# Patient Record
Sex: Male | Born: 1975 | Race: Black or African American | Hispanic: No | Marital: Single | Smoking: Current every day smoker
Health system: Southern US, Community
[De-identification: ages and names within clinical notes are randomized; demographics above are authoritative.]

## PROBLEM LIST (undated history)

## (undated) HISTORY — PX: EYE SURGERY: SHX253

---

## 2013-03-25 ENCOUNTER — Emergency Department (HOSPITAL_COMMUNITY): Payer: Medicaid Other

## 2013-03-25 ENCOUNTER — Encounter (HOSPITAL_COMMUNITY): Payer: Self-pay | Admitting: *Deleted

## 2013-03-25 ENCOUNTER — Emergency Department (HOSPITAL_COMMUNITY)
Admission: EM | Admit: 2013-03-25 | Discharge: 2013-03-25 | Disposition: A | Discharge status: Home / Self Care | Payer: Medicaid Other | Attending: Emergency Medicine | Admitting: Emergency Medicine

## 2013-03-25 DIAGNOSIS — R1013 Epigastric pain: Secondary | ICD-10-CM | POA: Insufficient documentation

## 2013-03-25 DIAGNOSIS — R109 Unspecified abdominal pain: Secondary | ICD-10-CM

## 2013-03-25 MED ORDER — PANTOPRAZOLE SODIUM 40 MG PO TBEC
40.0000 mg | DELAYED_RELEASE_TABLET | Freq: Once | ORAL | Status: AC
  Administered 2013-03-25: 40 mg via ORAL
  Filled 2013-03-25: qty 1

## 2013-03-25 MED ORDER — FAMOTIDINE 20 MG PO TABS
20.0000 mg | ORAL_TABLET | Freq: Once | ORAL | Status: AC
  Administered 2013-03-25: 20 mg via ORAL
  Filled 2013-03-25: qty 1

## 2013-03-25 NOTE — ED Provider Notes (Signed)
CSN: [id number]     Arrival date & time 03/25/13  0100 History     First MD Initiated Contact with Patient 03/25/13 0158     Chief Complaint  Patient presents with  . Abdominal Pain    onset was was 2 days ago. last bm was 4 hrs ago   (Consider location/radiation/quality/duration/timing/severity/associated sxs/prior Treatment) HPI HPI Comments: Bob Foley is a 37 y.o. male who presents to the Emergency Department complaining of abdominal pain he has had for two days. He has had a number of stools, all normal. He has  Aching feeling int he epigastric area and occasional sharp pains. He denies fever, chills, nausea, vomiting. He has taken no medicines.  History reviewed. No pertinent past medical history. History reviewed. No pertinent past surgical history. No family history on file. History  Substance Use Topics  . Smoking status: Not on file  . Smokeless tobacco: Not on file  . Alcohol Use: Not on file    Review of Systems  Constitutional: Negative for fever.       10 Systems reviewed and are negative for acute change except as noted in the HPI.  HENT: Negative for congestion.   Eyes: Negative for discharge and redness.  Respiratory: Negative for cough and shortness of breath.   Cardiovascular: Negative for chest pain.  Gastrointestinal: Negative for vomiting and abdominal pain.  Musculoskeletal: Negative for back pain.  Skin: Negative for rash.  Neurological: Negative for syncope, numbness and headaches.  Psychiatric/Behavioral:       No behavior change.    Allergies  Asa  Home Medications  No current outpatient prescriptions on file. BP 139/99  Pulse 86  Temp(Src) 97.8 F (36.6 C) (Oral)  Resp 20  SpO2 100% Physical Exam  Nursing note and vitals reviewed. Constitutional: He appears well-developed and well-nourished.  Awake, alert, nontoxic appearance.  HENT:  Head: Normocephalic and atraumatic.  Eyes: EOM are normal. Pupils are equal, round, and  reactive to light.  Neck: Neck supple.  Cardiovascular: Normal rate and intact distal pulses.   Pulmonary/Chest: Effort normal and breath sounds normal. He exhibits no tenderness.  Abdominal: Soft. There is tenderness. There is no rebound.  Mild discomfort with palpation of the epigastric area  Musculoskeletal: He exhibits no tenderness.  Baseline ROM, no obvious new focal weakness.  Neurological:  Mental status and motor strength appears baseline for patient and situation.  Skin: No rash noted.  Psychiatric: He has a normal mood and affect.    ED Course   Procedures (including critical care time)  Labs Reviewed - No data to display Dg Abd Acute W/chest  03/25/2013   *RADIOLOGY REPORT*  Clinical Data: Abdominal pain for several days.  ACUTE ABDOMEN SERIES (ABDOMEN 2 VIEW & CHEST 1 VIEW)  Comparison: None.  Findings: Mild prominence of the right hilum is most compatible with prominent vascular markings.  No airspace disease.  No free air underneath the hemidiaphragms.  The bowel gas pattern is normal.  There are no dilated loops of large or small bowel.  No organomegaly.  Stool and bowel gas extends to the level of the rectosigmoid.  IMPRESSION: No acute abnormality.  Normal bowel gas pattern.   Original Report Authenticated By: Andreas Newport, M.D.   Medications  pantoprazole (PROTONIX) EC tablet 40 mg (40 mg Oral Given 03/25/13 0211)  famotidine (PEPCID) tablet 20 mg (20 mg Oral Given 03/25/13 0211)    MDM  Patient with epigastric discomfort for two days. Given pepcid and protonix with  some relief. Acute abdominal series is negative for acute process. Pt stable in ED with no significant deterioration in condition.The patient appears reasonably screened and/or stabilized for discharge and I doubt any other medical condition or other [facility] requiring further screening, evaluation, or treatment in the ED at this time prior to discharge.  MDM Reviewed: nursing note and vitals Interpretation:  x-ray     Nicoletta Dress. Colon Branch, MD 03/25/13 [phone]

## 2013-03-25 NOTE — ED Notes (Signed)
Patient thinks he has an ulcer . States he is not vomiting but has been having several bms a day but are not loose bms

## 2016-08-15 ENCOUNTER — Emergency Department (HOSPITAL_COMMUNITY)
Admission: EM | Admit: 2016-08-15 | Discharge: 2016-08-15 | Disposition: A | Discharge status: Home / Self Care | Payer: Medicaid Other | Attending: Emergency Medicine | Admitting: Emergency Medicine

## 2016-08-15 ENCOUNTER — Encounter (HOSPITAL_COMMUNITY): Payer: Self-pay | Admitting: Emergency Medicine

## 2016-08-15 DIAGNOSIS — R197 Diarrhea, unspecified: Secondary | ICD-10-CM | POA: Insufficient documentation

## 2016-08-15 DIAGNOSIS — F17210 Nicotine dependence, cigarettes, uncomplicated: Secondary | ICD-10-CM | POA: Diagnosis not present

## 2016-08-15 DIAGNOSIS — R112 Nausea with vomiting, unspecified: Secondary | ICD-10-CM | POA: Diagnosis not present

## 2016-08-15 LAB — CBC
HEMATOCRIT: 45.4 % (ref 39.0–52.0)
Hemoglobin: 15.1 g/dL (ref 13.0–17.0)
MCH: 29.2 pg (ref 26.0–34.0)
MCHC: 33.3 g/dL (ref 30.0–36.0)
MCV: 87.6 fL (ref 78.0–100.0)
Platelets: 200 10*3/uL (ref 150–400)
RBC: 5.18 MIL/uL (ref 4.22–5.81)
RDW: 12.5 % (ref 11.5–15.5)
WBC: 14 10*3/uL — ABNORMAL HIGH (ref 4.0–10.5)

## 2016-08-15 LAB — COMPREHENSIVE METABOLIC PANEL
ALBUMIN: 4.6 g/dL (ref 3.5–5.0)
ALK PHOS: 69 U/L (ref 38–126)
ALT: 16 U/L — ABNORMAL LOW (ref 17–63)
AST: 22 U/L (ref 15–41)
Anion gap: 9 (ref 5–15)
BILIRUBIN TOTAL: 0.6 mg/dL (ref 0.3–1.2)
BUN: 12 mg/dL (ref 6–20)
CO2: 22 mmol/L (ref 22–32)
Calcium: 9.9 mg/dL (ref 8.9–10.3)
Chloride: 106 mmol/L (ref 101–111)
Creatinine, Ser: 0.96 mg/dL (ref 0.61–1.24)
GFR calc Af Amer: >60 mL/min (ref >60)
GFR calc non Af Amer: >60 mL/min (ref >60)
GLUCOSE: 102 mg/dL — AB (ref 65–99)
POTASSIUM: 3.9 mmol/L (ref 3.5–5.1)
Sodium: 137 mmol/L (ref 135–145)
TOTAL PROTEIN: 8.1 g/dL (ref 6.5–8.1)

## 2016-08-15 LAB — URINALYSIS, ROUTINE W REFLEX MICROSCOPIC
BILIRUBIN URINE: NEGATIVE
Glucose, UA: NEGATIVE mg/dL
Hgb urine dipstick: NEGATIVE
Ketones, ur: 20 mg/dL — AB
Leukocytes, UA: NEGATIVE
NITRITE: NEGATIVE
PH: 5 (ref 5.0–8.0)
Protein, ur: 30 mg/dL — AB
SPECIFIC GRAVITY, URINE: 1.03 (ref 1.005–1.030)
WBC, UA: NONE SEEN WBC/hpf (ref 0–5)

## 2016-08-15 LAB — LIPASE, BLOOD: Lipase: 34 U/L (ref 11–51)

## 2016-08-15 MED ORDER — ONDANSETRON HCL 4 MG/2ML IJ SOLN
4.0000 mg | INTRAMUSCULAR | Status: DC | PRN
  Administered 2016-08-15: 4 mg via INTRAVENOUS
  Filled 2016-08-15: qty 2

## 2016-08-15 MED ORDER — FAMOTIDINE IN NACL 20-0.9 MG/50ML-% IV SOLN
20.0000 mg | Freq: Once | INTRAVENOUS | Status: AC
  Administered 2016-08-15: 20 mg via INTRAVENOUS
  Filled 2016-08-15: qty 50

## 2016-08-15 MED ORDER — SODIUM CHLORIDE 0.9 % IV BOLUS (SEPSIS)
1000.0000 mL | Freq: Once | INTRAVENOUS | Status: AC
  Administered 2016-08-15: 1000 mL via INTRAVENOUS

## 2016-08-15 MED ORDER — ONDANSETRON HCL 4 MG PO TABS: 4.0000 mg | ORAL_TABLET | Freq: Three times a day (TID) | ORAL | 0 refills | Status: DC | PRN

## 2016-08-15 MED ORDER — DICYCLOMINE HCL 20 MG PO TABS: 20.0000 mg | ORAL_TABLET | Freq: Four times a day (QID) | ORAL | 0 refills | Status: DC | PRN

## 2016-08-15 MED ORDER — DICYCLOMINE HCL 10 MG/ML IM SOLN
20.0000 mg | Freq: Once | INTRAMUSCULAR | Status: AC
  Administered 2016-08-15: 20 mg via INTRAMUSCULAR
  Filled 2016-08-15: qty 2

## 2016-08-15 NOTE — ED Provider Notes (Signed)
[facility]-EMERGENCY DEPT Provider Note   CSN: [id number] Arrival date & time: 08/15/16  1558     History   Chief Complaint Chief Complaint  Patient presents with  . Emesis    HPI Bob Foley is a 40 y.o. male.  HPI  Pt was seen at 1625. Per pt, c/o gradual onset and persistence of multiple intermittent episodes of N/V/D that began last night.   Describes the stools as "watery." Has been associated with generalized body "aches." Denies abd pain, no CP/SOB, no back pain, no fevers, no black or blood in stools or emesis.    History reviewed. No pertinent past medical history.  There are no active problems to display for this patient.   History reviewed. No pertinent surgical history.     Home Medications    Prior to Admission medications   Not on File    Family History History reviewed. No pertinent family history.  Social History Social History  Substance Use Topics  . Smoking status: Current Every Day Smoker    Packs/day: 1.00    Types: Cigarettes  . Smokeless tobacco: Not on file  . Alcohol use No     Allergies   Asa [aspirin]   Review of Systems Review of Systems ROS: Statement: All systems negative except as marked or noted in the HPI; Constitutional: Negative for fever and chills. ; ; Eyes: Negative for eye pain, redness and discharge. ; ; ENMT: Negative for ear pain, hoarseness, nasal congestion, sinus pressure and sore throat. ; ; Cardiovascular: Negative for chest pain, palpitations, diaphoresis, dyspnea and peripheral edema. ; ; Respiratory: Negative for cough, wheezing and stridor. ; ; Gastrointestinal: +N/V/D. Negative for abdominal pain, blood in stool, hematemesis, jaundice and rectal bleeding. . ; ; Genitourinary: Negative for dysuria, flank pain and hematuria. ; ; Musculoskeletal: Negative for back pain and neck pain. Negative for swelling and trauma.; ; Skin: Negative for pruritus, rash, abrasions, blisters, bruising and skin lesion.; ; Neuro:  Negative for headache, lightheadedness and neck stiffness. Negative for weakness, altered level of consciousness, altered mental status, extremity weakness, paresthesias, involuntary movement, seizure and syncope.       Physical Exam Updated Vital Signs BP 114/75 (BP Location: Left Arm)   Pulse 91   Temp 98.2 F (36.8 C) (Oral)   Resp 16   Ht 5\' 11"  (1.803 m)   Wt 180 lb (81.6 kg)   SpO2 100%   BMI 25.10 kg/m   Physical Exam 1630: Physical examination:  Nursing notes reviewed; Vital signs and O2 SAT reviewed;  Constitutional: Well developed, Well nourished, In no acute distress; Head:  Normocephalic, atraumatic; Eyes: EOMI, PERRL, No scleral icterus; ENMT: Mouth and pharynx normal, Mucous membranes dry; Neck: Supple, Full range of motion, No lymphadenopathy; Cardiovascular: Regular rate and rhythm, No murmur, rub, or gallop; Respiratory: Breath sounds clear & equal bilaterally, No rales, rhonchi, wheezes.  Speaking full sentences with ease, Normal respiratory effort/excursion; Chest: Nontender, Movement normal; Abdomen: Soft, Nontender, Nondistended, Normal bowel sounds; Genitourinary: No CVA tenderness; Extremities: Pulses normal, No tenderness, No edema, No calf edema or asymmetry.; Neuro: AA&Ox3, Major CN grossly intact.  Speech clear. No gross focal motor or sensory deficits in extremities.; Skin: Color normal, Warm, Dry.   ED Treatments / Results  Labs (all labs ordered are listed, but only abnormal results are displayed)   EKG  EKG Interpretation None       Radiology   Procedures Procedures (including critical care time)  Medications Ordered in ED Medications  sodium chloride 0.9 % bolus 1,000 mL (not administered)  ondansetron (ZOFRAN) injection 4 mg (not administered)  famotidine (PEPCID) IVPB 20 mg premix (not administered)     Initial Impression / Assessment and Plan / ED Course  I have reviewed the triage vital signs and the nursing notes.  Pertinent  labs & imaging results that were available during my care of the patient were reviewed by me and considered in my medical decision making (see chart for details).  MDM Reviewed: previous chart, nursing note and vitals Reviewed previous: labs Interpretation: labs   Results for orders placed or performed during the hospital encounter of 08/15/16  Lipase, blood  Result Value Ref Range   Lipase 34 11 - 51 U/L  Comprehensive metabolic panel  Result Value Ref Range   Sodium 137 135 - 145 mmol/L   Potassium 3.9 3.5 - 5.1 mmol/L   Chloride 106 101 - 111 mmol/L   CO2 22 22 - 32 mmol/L   Glucose, Bld 102 (H) 65 - 99 mg/dL   BUN 12 6 - 20 mg/dL   Creatinine, Ser 4.090.96 0.61 - 1.24 mg/dL   Calcium 9.9 8.9 - 81.110.3 mg/dL   Total Protein 8.1 6.5 - 8.1 g/dL   Albumin 4.6 3.5 - 5.0 g/dL   AST 22 15 - 41 U/L   ALT 16 (L) 17 - 63 U/L   Alkaline Phosphatase 69 38 - 126 U/L   Total Bilirubin 0.6 0.3 - 1.2 mg/dL   GFR calc non Af Amer >60 >60 mL/min   GFR calc Af Amer >60 >60 mL/min   Anion gap 9 5 - 15  CBC  Result Value Ref Range   WBC 14.0 (H) 4.0 - 10.5 K/uL   RBC 5.18 4.22 - 5.81 MIL/uL   Hemoglobin 15.1 13.0 - 17.0 g/dL   HCT 91.445.4 78.239.0 - 95.652.0 %   MCV 87.6 78.0 - 100.0 fL   MCH 29.2 26.0 - 34.0 pg   MCHC 33.3 30.0 - 36.0 g/dL   RDW 21.312.5 08.611.5 - 57.815.5 %   Platelets 200 150 - 400 K/uL  Urinalysis, Routine w reflex microscopic  Result Value Ref Range   Color, Urine YELLOW YELLOW   APPearance TURBID (A) CLEAR   Specific Gravity, Urine 1.030 1.005 - 1.030   pH 5.0 5.0 - 8.0   Glucose, UA NEGATIVE NEGATIVE mg/dL   Hgb urine dipstick NEGATIVE NEGATIVE   Bilirubin Urine NEGATIVE NEGATIVE   Ketones, ur 20 (A) NEGATIVE mg/dL   Protein, ur 30 (A) NEGATIVE mg/dL   Nitrite NEGATIVE NEGATIVE   Leukocytes, UA NEGATIVE NEGATIVE   RBC / HPF 0-5 0 - 5 RBC/hpf   WBC, UA NONE SEEN 0 - 5 WBC/hpf   Bacteria, UA FEW (A) NONE SEEN   Mucous PRESENT     2000:  Pt has tol PO well while in the ED without  N/V.  No stooling while in the ED.  Abd remains benign, VSS. Feels better and wants to go home now. Tx symptomatically at this time. Dx and testing d/w pt and family.  Questions answered.  Verb understanding, agreeable to d/c home with outpt f/u.     Final Clinical Impressions(s) / ED Diagnoses   Final diagnoses:  None    New Prescriptions New Prescriptions   No medications on file     Samuel JesterKathleen Chantale Leugers, DO 08/18/16 1707

## 2016-08-15 NOTE — Discharge Instructions (Signed)
Take the prescriptions as directed.  Increase your fluid intake (ie:  Gatoraide) for the next few days.  Eat a bland diet and advance to your regular diet slowly as you can tolerate it.   Avoid full strength juices, as well as milk and milk products until your diarrhea has resolved.   Call your regular medical doctor Tuesday to schedule a follow up appointment next week.  Return to the Emergency Department immediately if not improving (or even worsening) despite taking the medicines as prescribed, any black or bloody stool or vomit, if you develop a fever over "101," or for any other concerns.

## 2016-08-15 NOTE — ED Triage Notes (Signed)
Pt reports emesis since last night with diarrhea. Pt has generalized abd pain and body aches.

## 2018-05-10 ENCOUNTER — Emergency Department (HOSPITAL_COMMUNITY)
Admission: EM | Admit: 2018-05-10 | Discharge: 2018-05-10 | Disposition: A | Discharge status: Home / Self Care | Payer: Medicaid Other | Attending: Emergency Medicine | Admitting: Emergency Medicine

## 2018-05-10 ENCOUNTER — Other Ambulatory Visit: Payer: Self-pay

## 2018-05-10 ENCOUNTER — Emergency Department (HOSPITAL_COMMUNITY): Payer: Medicaid Other

## 2018-05-10 ENCOUNTER — Encounter (HOSPITAL_COMMUNITY): Payer: Self-pay | Admitting: *Deleted

## 2018-05-10 DIAGNOSIS — Y9289 Other specified places as the place of occurrence of the external cause: Secondary | ICD-10-CM | POA: Diagnosis not present

## 2018-05-10 DIAGNOSIS — M7918 Myalgia, other site: Secondary | ICD-10-CM | POA: Insufficient documentation

## 2018-05-10 DIAGNOSIS — Y999 Unspecified external cause status: Secondary | ICD-10-CM | POA: Insufficient documentation

## 2018-05-10 DIAGNOSIS — R0789 Other chest pain: Secondary | ICD-10-CM | POA: Diagnosis not present

## 2018-05-10 DIAGNOSIS — M25531 Pain in right wrist: Secondary | ICD-10-CM

## 2018-05-10 DIAGNOSIS — W01198A Fall on same level from slipping, tripping and stumbling with subsequent striking against other object, initial encounter: Secondary | ICD-10-CM | POA: Diagnosis not present

## 2018-05-10 DIAGNOSIS — S0121XA Laceration without foreign body of nose, initial encounter: Secondary | ICD-10-CM | POA: Diagnosis not present

## 2018-05-10 DIAGNOSIS — R52 Pain, unspecified: Secondary | ICD-10-CM

## 2018-05-10 DIAGNOSIS — S01122A Laceration with foreign body of left eyelid and periocular area, initial encounter: Secondary | ICD-10-CM | POA: Insufficient documentation

## 2018-05-10 DIAGNOSIS — Z79899 Other long term (current) drug therapy: Secondary | ICD-10-CM | POA: Insufficient documentation

## 2018-05-10 DIAGNOSIS — M25521 Pain in right elbow: Secondary | ICD-10-CM

## 2018-05-10 DIAGNOSIS — R51 Headache: Secondary | ICD-10-CM | POA: Diagnosis not present

## 2018-05-10 DIAGNOSIS — S0083XA Contusion of other part of head, initial encounter: Secondary | ICD-10-CM | POA: Diagnosis present

## 2018-05-10 DIAGNOSIS — Y9301 Activity, walking, marching and hiking: Secondary | ICD-10-CM | POA: Diagnosis not present

## 2018-05-10 DIAGNOSIS — S01112A Laceration without foreign body of left eyelid and periocular area, initial encounter: Secondary | ICD-10-CM

## 2018-05-10 DIAGNOSIS — M25532 Pain in left wrist: Secondary | ICD-10-CM

## 2018-05-10 DIAGNOSIS — M25522 Pain in left elbow: Secondary | ICD-10-CM

## 2018-05-10 MED ORDER — NAPROXEN 250 MG PO TABS
250.0000 mg | ORAL_TABLET | Freq: Once | ORAL | Status: AC
  Administered 2018-05-10: 250 mg via ORAL
  Filled 2018-05-10: qty 1

## 2018-05-10 NOTE — ED Notes (Signed)
ED Provider at bedside. 

## 2018-05-10 NOTE — ED Notes (Signed)
Pt given urinal to provide urine sample

## 2018-05-10 NOTE — Discharge Instructions (Signed)
Use ice to the painful areas for comfort.  You can take ibuprofen 600 mg 4 times a day OR leave 2 tablets over-the-counter twice a day for pain.  You will be sore for the next several days.  Return to the ED for any problems on the head injury sheet.  If he continued to have pain after the next week follow-up with your primary care doctor.

## 2018-05-10 NOTE — ED Notes (Addendum)
Pt called several times before coming back to room- registration reports pt has wheeled himself outside several times to smoke.

## 2018-05-10 NOTE — ED Triage Notes (Signed)
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
## 2018-08-02 ENCOUNTER — Other Ambulatory Visit: Payer: Self-pay

## 2018-08-02 ENCOUNTER — Emergency Department (HOSPITAL_COMMUNITY)
Admission: EM | Admit: 2018-08-02 | Discharge: 2018-08-02 | Discharge status: Against Medical Advice | Payer: Medicaid Other | Attending: Emergency Medicine | Admitting: Emergency Medicine

## 2018-08-02 ENCOUNTER — Emergency Department (HOSPITAL_COMMUNITY): Payer: Medicaid Other

## 2018-08-02 DIAGNOSIS — S02832A Fracture of medial orbital wall, left side, initial encounter for closed fracture: Secondary | ICD-10-CM | POA: Insufficient documentation

## 2018-08-02 DIAGNOSIS — F17210 Nicotine dependence, cigarettes, uncomplicated: Secondary | ICD-10-CM | POA: Diagnosis not present

## 2018-08-02 DIAGNOSIS — Y929 Unspecified place or not applicable: Secondary | ICD-10-CM | POA: Insufficient documentation

## 2018-08-02 DIAGNOSIS — Y9389 Activity, other specified: Secondary | ICD-10-CM | POA: Insufficient documentation

## 2018-08-02 DIAGNOSIS — S0280XA Fracture of other specified skull and facial bones, unspecified side, initial encounter for closed fracture: Secondary | ICD-10-CM

## 2018-08-02 DIAGNOSIS — S0285XA Fracture of orbit, unspecified, initial encounter for closed fracture: Secondary | ICD-10-CM

## 2018-08-02 DIAGNOSIS — Y998 Other external cause status: Secondary | ICD-10-CM | POA: Diagnosis not present

## 2018-08-02 DIAGNOSIS — S0592XA Unspecified injury of left eye and orbit, initial encounter: Secondary | ICD-10-CM | POA: Diagnosis present

## 2018-08-02 MED ORDER — FLUORESCEIN SODIUM 1 MG OP STRP
1.0000 | ORAL_STRIP | Freq: Once | OPHTHALMIC | Status: DC
  Filled 2018-08-02: qty 1
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
## 2020-02-09 ENCOUNTER — Other Ambulatory Visit: Payer: Self-pay

## 2020-02-09 ENCOUNTER — Emergency Department (HOSPITAL_COMMUNITY): Payer: Medicaid Other

## 2020-02-09 ENCOUNTER — Inpatient Hospital Stay (HOSPITAL_COMMUNITY)
Admission: AC | Admit: 2020-02-09 | Discharge: 2020-02-11 | DRG: 480 | Disposition: A | Discharge status: Home / Self Care | Payer: Medicaid Other | Source: Other Acute Inpatient Hospital | Attending: Orthopedic Surgery | Admitting: Orthopedic Surgery

## 2020-02-09 ENCOUNTER — Inpatient Hospital Stay (HOSPITAL_COMMUNITY): Payer: Medicaid Other

## 2020-02-09 ENCOUNTER — Encounter (HOSPITAL_COMMUNITY): Payer: Self-pay | Admitting: Orthopedic Surgery

## 2020-02-09 DIAGNOSIS — T79A29A Traumatic compartment syndrome of unspecified lower extremity, initial encounter: Secondary | ICD-10-CM | POA: Diagnosis present

## 2020-02-09 DIAGNOSIS — S82141A Displaced bicondylar fracture of right tibia, initial encounter for closed fracture: Secondary | ICD-10-CM

## 2020-02-09 DIAGNOSIS — W3400XA Accidental discharge from unspecified firearms or gun, initial encounter: Secondary | ICD-10-CM | POA: Diagnosis not present

## 2020-02-09 DIAGNOSIS — S72401B Unspecified fracture of lower end of right femur, initial encounter for open fracture type I or II: Secondary | ICD-10-CM

## 2020-02-09 DIAGNOSIS — Z886 Allergy status to analgesic agent status: Secondary | ICD-10-CM

## 2020-02-09 DIAGNOSIS — Z20822 Contact with and (suspected) exposure to covid-19: Secondary | ICD-10-CM | POA: Diagnosis present

## 2020-02-09 DIAGNOSIS — S72431B Displaced fracture of medial condyle of right femur, initial encounter for open fracture type I or II: Secondary | ICD-10-CM | POA: Diagnosis present

## 2020-02-09 DIAGNOSIS — E559 Vitamin D deficiency, unspecified: Secondary | ICD-10-CM | POA: Diagnosis present

## 2020-02-09 DIAGNOSIS — F17210 Nicotine dependence, cigarettes, uncomplicated: Secondary | ICD-10-CM | POA: Diagnosis present

## 2020-02-09 DIAGNOSIS — S82101B Unspecified fracture of upper end of right tibia, initial encounter for open fracture type I or II: Secondary | ICD-10-CM | POA: Diagnosis present

## 2020-02-09 DIAGNOSIS — T148XXA Other injury of unspecified body region, initial encounter: Secondary | ICD-10-CM

## 2020-02-09 DIAGNOSIS — S82144B Nondisplaced bicondylar fracture of right tibia, initial encounter for open fracture type I or II: Secondary | ICD-10-CM | POA: Diagnosis present

## 2020-02-09 DIAGNOSIS — S82201A Unspecified fracture of shaft of right tibia, initial encounter for closed fracture: Secondary | ICD-10-CM

## 2020-02-09 DIAGNOSIS — W458XXA Other foreign body or object entering through skin, initial encounter: Secondary | ICD-10-CM | POA: Diagnosis present

## 2020-02-09 DIAGNOSIS — Z419 Encounter for procedure for purposes other than remedying health state, unspecified: Secondary | ICD-10-CM

## 2020-02-09 DIAGNOSIS — T79A21A Traumatic compartment syndrome of right lower extremity, initial encounter: Secondary | ICD-10-CM | POA: Diagnosis present

## 2020-02-09 HISTORY — DX: Unspecified fracture of lower end of right femur, initial encounter for open fracture type I or II: S72.401B

## 2020-02-09 LAB — CBC WITH DIFFERENTIAL/PLATELET
Abs Immature Granulocytes: 0.06 10*3/uL (ref 0.00–0.07)
Basophils Absolute: 0 10*3/uL (ref 0.0–0.1)
Basophils Relative: 0 %
Eosinophils Absolute: 0 10*3/uL (ref 0.0–0.5)
Eosinophils Relative: 0 %
HCT: 32.6 % — ABNORMAL LOW (ref 39.0–52.0)
Hemoglobin: 10.7 g/dL — ABNORMAL LOW (ref 13.0–17.0)
Immature Granulocytes: 0 %
Lymphocytes Relative: 8 %
Lymphs Abs: 1.1 10*3/uL (ref 0.7–4.0)
MCH: 30 pg (ref 26.0–34.0)
MCHC: 32.8 g/dL (ref 30.0–36.0)
MCV: 91.3 fL (ref 80.0–100.0)
Monocytes Absolute: 0.8 10*3/uL (ref 0.1–1.0)
Monocytes Relative: 5 %
Neutro Abs: 12.7 10*3/uL — ABNORMAL HIGH (ref 1.7–7.7)
Neutrophils Relative %: 87 %
Platelets: 209 10*3/uL (ref 150–400)
RBC: 3.57 MIL/uL — ABNORMAL LOW (ref 4.22–5.81)
RDW: 13.5 % (ref 11.5–15.5)
WBC: 14.7 10*3/uL — ABNORMAL HIGH (ref 4.0–10.5)
nRBC: 0 % (ref 0.0–0.2)

## 2020-02-09 LAB — BASIC METABOLIC PANEL
Anion gap: 13 (ref 5–15)
BUN: 11 mg/dL (ref 6–20)
CO2: 16 mmol/L — ABNORMAL LOW (ref 22–32)
Calcium: 7.1 mg/dL — ABNORMAL LOW (ref 8.9–10.3)
Chloride: 110 mmol/L (ref 98–111)
Creatinine, Ser: 0.95 mg/dL (ref 0.61–1.24)
GFR calc Af Amer: >60 mL/min (ref >60)
GFR calc non Af Amer: >60 mL/min (ref >60)
Glucose, Bld: 87 mg/dL (ref 70–99)
Potassium: 3.4 mmol/L — ABNORMAL LOW (ref 3.5–5.1)
Sodium: 139 mmol/L (ref 135–145)

## 2020-02-09 LAB — SARS CORONAVIRUS 2 BY RT PCR (HOSPITAL ORDER, PERFORMED IN ~~LOC~~ HOSPITAL LAB): SARS Coronavirus 2: NEGATIVE

## 2020-02-09 LAB — HIV ANTIBODY (ROUTINE TESTING W REFLEX): HIV Screen 4th Generation wRfx: NONREACTIVE

## 2020-02-09 MED ORDER — FENTANYL CITRATE (PF) 100 MCG/2ML IJ SOLN
100.0000 ug | Freq: Once | INTRAMUSCULAR | Status: AC
  Administered 2020-02-09: 100 ug via INTRAVENOUS
  Filled 2020-02-09: qty 2

## 2020-02-09 MED ORDER — LACTATED RINGERS IV SOLN: INTRAVENOUS | Status: DC

## 2020-02-09 MED ORDER — HYDROXYZINE HCL 25 MG PO TABS
50.0000 mg | ORAL_TABLET | Freq: Three times a day (TID) | ORAL | Status: DC | PRN
  Administered 2020-02-09: 50 mg via ORAL
  Filled 2020-02-09: qty 2

## 2020-02-09 MED ORDER — CEFAZOLIN SODIUM-DEXTROSE 2-4 GM/100ML-% IV SOLN
2.0000 g | INTRAVENOUS | Status: AC
  Administered 2020-02-10: 2 g via INTRAVENOUS
  Filled 2020-02-09: qty 100

## 2020-02-09 MED ORDER — LACTATED RINGERS IV BOLUS
2000.0000 mL | Freq: Once | INTRAVENOUS | Status: AC
  Administered 2020-02-09: 2000 mL via INTRAVENOUS

## 2020-02-09 MED ORDER — MORPHINE SULFATE (PF) 4 MG/ML IV SOLN
4.0000 mg | Freq: Once | INTRAVENOUS | Status: AC
  Administered 2020-02-09: 4 mg via INTRAVENOUS
  Filled 2020-02-09: qty 1

## 2020-02-09 MED ORDER — POVIDONE-IODINE 10 % EX SWAB: 2.0000 "application " | Freq: Once | CUTANEOUS | Status: DC

## 2020-02-09 MED ORDER — ENOXAPARIN SODIUM 40 MG/0.4ML ~~LOC~~ SOLN
40.0000 mg | SUBCUTANEOUS | Status: DC
  Administered 2020-02-09: 40 mg via SUBCUTANEOUS
  Filled 2020-02-09: qty 0.4

## 2020-02-09 MED ORDER — CHLORHEXIDINE GLUCONATE 4 % EX LIQD: 60.0000 mL | Freq: Once | CUTANEOUS | Status: DC

## 2020-02-09 MED ORDER — ONDANSETRON HCL 4 MG/2ML IJ SOLN: 4.0000 mg | Freq: Four times a day (QID) | INTRAMUSCULAR | Status: DC | PRN

## 2020-02-09 MED ORDER — OXYCODONE HCL 5 MG PO TABS
5.0000 mg | ORAL_TABLET | ORAL | Status: DC | PRN
  Administered 2020-02-09: 15 mg via ORAL
  Administered 2020-02-09: 10 mg via ORAL
  Administered 2020-02-09 – 2020-02-10 (×3): 15 mg via ORAL
  Filled 2020-02-09: qty 3
  Filled 2020-02-09: qty 2
  Filled 2020-02-09 (×3): qty 3

## 2020-02-09 MED ORDER — MORPHINE SULFATE (PF) 2 MG/ML IV SOLN
2.0000 mg | INTRAVENOUS | Status: DC | PRN
  Administered 2020-02-09 – 2020-02-10 (×5): 2 mg via INTRAVENOUS
  Filled 2020-02-09 (×4): qty 1

## 2020-02-09 MED ORDER — METHOCARBAMOL 500 MG PO TABS
500.0000 mg | ORAL_TABLET | Freq: Four times a day (QID) | ORAL | Status: DC | PRN
  Administered 2020-02-09 – 2020-02-10 (×3): 500 mg via ORAL
  Filled 2020-02-09 (×3): qty 1

## 2020-02-09 MED ORDER — METHOCARBAMOL 1000 MG/10ML IJ SOLN
500.0000 mg | Freq: Four times a day (QID) | INTRAVENOUS | Status: DC | PRN
  Filled 2020-02-09: qty 5

## 2020-02-09 MED ORDER — ENSURE PRE-SURGERY PO LIQD
296.0000 mL | Freq: Once | ORAL | Status: DC
  Filled 2020-02-09: qty 296

## 2020-02-09 MED ORDER — ONDANSETRON HCL 4 MG PO TABS: 4.0000 mg | ORAL_TABLET | Freq: Four times a day (QID) | ORAL | Status: DC | PRN
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
## 2020-02-09 NOTE — ED Notes (Signed)
Called 5N to give report, no answer at this time.
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
## 2020-02-09 NOTE — ED Notes (Signed)
Pt given ginger ale.

## 2020-02-09 NOTE — Progress Notes (Signed)
Catarina Hartshorn, floor nurse, to make aware that consent order for surgery was in signed and held orders. Orders released at this time. Verbalized understanding.

## 2020-02-09 NOTE — ED Notes (Signed)
Report given to 5N 
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
## 2020-02-10 ENCOUNTER — Inpatient Hospital Stay (HOSPITAL_COMMUNITY): Payer: Medicaid Other | Admitting: Anesthesiology

## 2020-02-10 ENCOUNTER — Encounter (HOSPITAL_COMMUNITY): Payer: Self-pay | Admitting: Orthopedic Surgery

## 2020-02-10 ENCOUNTER — Inpatient Hospital Stay (HOSPITAL_COMMUNITY): Payer: Medicaid Other

## 2020-02-10 ENCOUNTER — Encounter (HOSPITAL_COMMUNITY)
Admission: AC | Disposition: A | Discharge status: Home / Self Care | Payer: Self-pay | Source: Other Acute Inpatient Hospital | Attending: Orthopedic Surgery

## 2020-02-10 HISTORY — PX: ORIF TIBIA PLATEAU: SHX2132

## 2020-02-10 HISTORY — PX: ORIF FEMUR FRACTURE: SHX2119

## 2020-02-10 LAB — CBC
HCT: 30.1 % — ABNORMAL LOW (ref 39.0–52.0)
Hemoglobin: 10.1 g/dL — ABNORMAL LOW (ref 13.0–17.0)
MCH: 30.2 pg (ref 26.0–34.0)
MCHC: 33.6 g/dL (ref 30.0–36.0)
MCV: 90.1 fL (ref 80.0–100.0)
Platelets: 162 10*3/uL (ref 150–400)
RBC: 3.34 MIL/uL — ABNORMAL LOW (ref 4.22–5.81)
RDW: 12.9 % (ref 11.5–15.5)
WBC: 8.2 10*3/uL (ref 4.0–10.5)
nRBC: 0 % (ref 0.0–0.2)

## 2020-02-10 LAB — VITAMIN D 25 HYDROXY (VIT D DEFICIENCY, FRACTURES): Vit D, 25-Hydroxy: 19.72 ng/mL — ABNORMAL LOW (ref 30–100)

## 2020-02-10 SURGERY — OPEN REDUCTION INTERNAL FIXATION (ORIF) DISTAL FEMUR FRACTURE
Anesthesia: General | Site: Knee | Laterality: Right

## 2020-02-10 MED ORDER — METHOCARBAMOL 1000 MG/10ML IJ SOLN
500.0000 mg | Freq: Four times a day (QID) | INTRAVENOUS | Status: DC
  Filled 2020-02-10 (×7): qty 5

## 2020-02-10 MED ORDER — 0.9 % SODIUM CHLORIDE (POUR BTL) OPTIME
TOPICAL | Status: DC | PRN
  Administered 2020-02-10 (×2): 1000 mL

## 2020-02-10 MED ORDER — DOCUSATE SODIUM 100 MG PO CAPS
100.0000 mg | ORAL_CAPSULE | Freq: Two times a day (BID) | ORAL | Status: DC
  Administered 2020-02-10 – 2020-02-11 (×2): 100 mg via ORAL
  Filled 2020-02-10 (×2): qty 1

## 2020-02-10 MED ORDER — SUGAMMADEX SODIUM 200 MG/2ML IV SOLN
INTRAVENOUS | Status: DC | PRN
  Administered 2020-02-10: 160 mg via INTRAVENOUS

## 2020-02-10 MED ORDER — ROCURONIUM BROMIDE 10 MG/ML (PF) SYRINGE
PREFILLED_SYRINGE | INTRAVENOUS | Status: DC | PRN
  Administered 2020-02-10 (×3): 50 mg via INTRAVENOUS

## 2020-02-10 MED ORDER — KETAMINE HCL 50 MG/5ML IJ SOSY
PREFILLED_SYRINGE | INTRAMUSCULAR | Status: AC
  Filled 2020-02-10: qty 5

## 2020-02-10 MED ORDER — MAGNESIUM HYDROXIDE 400 MG/5ML PO SUSP: 30.0000 mL | Freq: Every day | ORAL | Status: DC | PRN

## 2020-02-10 MED ORDER — FENTANYL CITRATE (PF) 100 MCG/2ML IJ SOLN
INTRAMUSCULAR | Status: DC | PRN
  Administered 2020-02-10: 100 ug via INTRAVENOUS
  Administered 2020-02-10: 50 ug via INTRAVENOUS

## 2020-02-10 MED ORDER — HYDROMORPHONE HCL 1 MG/ML IJ SOLN: 0.2500 mg | INTRAMUSCULAR | Status: DC | PRN

## 2020-02-10 MED ORDER — ASCORBIC ACID 500 MG PO TABS
1000.0000 mg | ORAL_TABLET | Freq: Every day | ORAL | Status: DC
  Administered 2020-02-11: 1000 mg via ORAL
  Filled 2020-02-10: qty 2

## 2020-02-10 MED ORDER — ONDANSETRON HCL 4 MG/2ML IJ SOLN
INTRAMUSCULAR | Status: DC | PRN
  Administered 2020-02-10: 4 mg via INTRAVENOUS

## 2020-02-10 MED ORDER — METHOCARBAMOL 500 MG PO TABS
750.0000 mg | ORAL_TABLET | Freq: Four times a day (QID) | ORAL | Status: DC
  Administered 2020-02-10 – 2020-02-11 (×5): 750 mg via ORAL
  Filled 2020-02-10 (×5): qty 2

## 2020-02-10 MED ORDER — CHLORHEXIDINE GLUCONATE 0.12 % MT SOLN: 15.0000 mL | Freq: Once | OROMUCOSAL | Status: AC

## 2020-02-10 MED ORDER — FENTANYL CITRATE (PF) 250 MCG/5ML IJ SOLN
INTRAMUSCULAR | Status: AC
  Filled 2020-02-10: qty 5

## 2020-02-10 MED ORDER — ONDANSETRON HCL 4 MG/2ML IJ SOLN: 4.0000 mg | Freq: Once | INTRAMUSCULAR | Status: DC | PRN

## 2020-02-10 MED ORDER — OXYCODONE HCL 5 MG/5ML PO SOLN: 5.0000 mg | Freq: Once | ORAL | Status: DC | PRN

## 2020-02-10 MED ORDER — CHLORHEXIDINE GLUCONATE 0.12 % MT SOLN
OROMUCOSAL | Status: AC
  Administered 2020-02-10: 15 mL via OROMUCOSAL
  Filled 2020-02-10: qty 15

## 2020-02-10 MED ORDER — DEXMEDETOMIDINE HCL IN NACL 80 MCG/20ML IV SOLN
INTRAVENOUS | Status: AC
  Filled 2020-02-10: qty 20

## 2020-02-10 MED ORDER — DEXAMETHASONE SODIUM PHOSPHATE 10 MG/ML IJ SOLN
INTRAMUSCULAR | Status: DC | PRN
  Administered 2020-02-10: 10 mg via INTRAVENOUS

## 2020-02-10 MED ORDER — VITAMIN D 25 MCG (1000 UNIT) PO TABS
2000.0000 [IU] | ORAL_TABLET | Freq: Two times a day (BID) | ORAL | Status: DC
  Administered 2020-02-10 – 2020-02-11 (×2): 2000 [IU] via ORAL
  Filled 2020-02-10 (×2): qty 2

## 2020-02-10 MED ORDER — ACETAMINOPHEN 325 MG PO TABS: 325.0000 mg | ORAL_TABLET | Freq: Four times a day (QID) | ORAL | Status: DC | PRN

## 2020-02-10 MED ORDER — PANTOPRAZOLE SODIUM 40 MG PO TBEC
40.0000 mg | DELAYED_RELEASE_TABLET | Freq: Every day | ORAL | Status: DC
  Administered 2020-02-10 – 2020-02-11 (×2): 40 mg via ORAL
  Filled 2020-02-10 (×2): qty 1

## 2020-02-10 MED ORDER — METOCLOPRAMIDE HCL 5 MG/ML IJ SOLN: 5.0000 mg | Freq: Three times a day (TID) | INTRAMUSCULAR | Status: DC | PRN

## 2020-02-10 MED ORDER — CEFAZOLIN SODIUM-DEXTROSE 2-4 GM/100ML-% IV SOLN
2.0000 g | Freq: Three times a day (TID) | INTRAVENOUS | Status: AC
  Administered 2020-02-10 – 2020-02-11 (×3): 2 g via INTRAVENOUS
  Filled 2020-02-10 (×3): qty 100

## 2020-02-10 MED ORDER — METOCLOPRAMIDE HCL 5 MG PO TABS: 5.0000 mg | ORAL_TABLET | Freq: Three times a day (TID) | ORAL | Status: DC | PRN

## 2020-02-10 MED ORDER — OXYCODONE HCL 5 MG PO TABS: 5.0000 mg | ORAL_TABLET | Freq: Once | ORAL | Status: DC | PRN

## 2020-02-10 MED ORDER — POTASSIUM CHLORIDE IN NACL 20-0.9 MEQ/L-% IV SOLN
INTRAVENOUS | Status: DC
  Filled 2020-02-10: qty 1000

## 2020-02-10 MED ORDER — ENOXAPARIN SODIUM 40 MG/0.4ML ~~LOC~~ SOLN
40.0000 mg | SUBCUTANEOUS | Status: DC
  Administered 2020-02-11: 40 mg via SUBCUTANEOUS
  Filled 2020-02-10: qty 0.4

## 2020-02-10 MED ORDER — BISACODYL 5 MG PO TBEC: 5.0000 mg | DELAYED_RELEASE_TABLET | Freq: Every day | ORAL | Status: DC | PRN

## 2020-02-10 MED ORDER — MIDAZOLAM HCL 5 MG/5ML IJ SOLN
INTRAMUSCULAR | Status: DC | PRN
  Administered 2020-02-10: 2 mg via INTRAVENOUS

## 2020-02-10 MED ORDER — LACTATED RINGERS IV SOLN: INTRAVENOUS | Status: DC

## 2020-02-10 MED ORDER — PROPOFOL 10 MG/ML IV BOLUS
INTRAVENOUS | Status: DC | PRN
  Administered 2020-02-10: 200 mg via INTRAVENOUS

## 2020-02-10 MED ORDER — OXYCODONE-ACETAMINOPHEN 7.5-325 MG PO TABS: 1.0000 | ORAL_TABLET | Freq: Four times a day (QID) | ORAL | Status: DC | PRN

## 2020-02-10 MED ORDER — ACETAMINOPHEN 500 MG PO TABS
500.0000 mg | ORAL_TABLET | Freq: Three times a day (TID) | ORAL | Status: DC
  Administered 2020-02-10 – 2020-02-11 (×4): 500 mg via ORAL
  Filled 2020-02-10 (×4): qty 1

## 2020-02-10 MED ORDER — ORAL CARE MOUTH RINSE: 15.0000 mL | Freq: Once | OROMUCOSAL | Status: AC

## 2020-02-10 MED ORDER — HYDROMORPHONE HCL 1 MG/ML IJ SOLN
0.5000 mg | INTRAMUSCULAR | Status: DC | PRN
  Administered 2020-02-10: 1 mg via INTRAVENOUS
  Filled 2020-02-10 (×2): qty 1

## 2020-02-10 MED ORDER — ADULT MULTIVITAMIN W/MINERALS CH
1.0000 | ORAL_TABLET | Freq: Every day | ORAL | Status: DC
  Administered 2020-02-11: 1 via ORAL
  Filled 2020-02-10: qty 1

## 2020-02-10 MED ORDER — MIDAZOLAM HCL 2 MG/2ML IJ SOLN
INTRAMUSCULAR | Status: AC
  Filled 2020-02-10: qty 2

## 2020-02-10 MED ORDER — ONDANSETRON HCL 4 MG/2ML IJ SOLN: 4.0000 mg | Freq: Four times a day (QID) | INTRAMUSCULAR | Status: DC | PRN

## 2020-02-10 MED ORDER — GABAPENTIN 600 MG PO TABS
300.0000 mg | ORAL_TABLET | Freq: Two times a day (BID) | ORAL | Status: DC
  Administered 2020-02-10 – 2020-02-11 (×3): 300 mg via ORAL
  Filled 2020-02-10 (×4): qty 0.5

## 2020-02-10 MED ORDER — PROPOFOL 10 MG/ML IV BOLUS
INTRAVENOUS | Status: AC
  Filled 2020-02-10: qty 40

## 2020-02-10 MED ORDER — ONDANSETRON HCL 4 MG PO TABS: 4.0000 mg | ORAL_TABLET | Freq: Four times a day (QID) | ORAL | Status: DC | PRN

## 2020-02-10 SURGICAL SUPPLY — 94 items
BANDAGE ESMARK 6X9 LF (GAUZE/BANDAGES/DRESSINGS) ×1 IMPLANT
BIT DRILL 3.0 6IN (DRILL) ×1 IMPLANT
BIT DRILL 4.8X200 CANN (BIT) ×3 IMPLANT
BLADE CLIPPER SURG (BLADE) IMPLANT
BLADE SURG 10 STRL SS (BLADE) ×3 IMPLANT
BLADE SURG 15 STRL LF DISP TIS (BLADE) ×1 IMPLANT
BLADE SURG 15 STRL SS (BLADE) ×2
BNDG COHESIVE 4X5 TAN STRL (GAUZE/BANDAGES/DRESSINGS) IMPLANT
BNDG ELASTIC 4X5.8 VLCR STR LF (GAUZE/BANDAGES/DRESSINGS) ×3 IMPLANT
BNDG ELASTIC 6X5.8 VLCR STR LF (GAUZE/BANDAGES/DRESSINGS) ×3 IMPLANT
BNDG ESMARK 6X9 LF (GAUZE/BANDAGES/DRESSINGS) ×3
BNDG GAUZE ELAST 4 BULKY (GAUZE/BANDAGES/DRESSINGS) ×3 IMPLANT
BRUSH SCRUB EZ PLAIN DRY (MISCELLANEOUS) ×6 IMPLANT
CANISTER SUCT 3000ML PPV (MISCELLANEOUS) ×3 IMPLANT
COVER SURGICAL LIGHT HANDLE (MISCELLANEOUS) ×3 IMPLANT
COVER WAND RF STERILE (DRAPES) IMPLANT
CUFF TOURN SGL QUICK 34 (TOURNIQUET CUFF)
CUFF TRNQT CYL 34X4.125X (TOURNIQUET CUFF) IMPLANT
DRAPE C-ARM 42X72 X-RAY (DRAPES) ×3 IMPLANT
DRAPE C-ARMOR (DRAPES) ×3 IMPLANT
DRAPE HALF SHEET 40X57 (DRAPES) IMPLANT
DRAPE IMP U-DRAPE 54X76 (DRAPES) ×3 IMPLANT
DRAPE INCISE IOBAN 66X45 STRL (DRAPES) ×3 IMPLANT
DRAPE ORTHO SPLIT 77X108 STRL (DRAPES) ×4
DRAPE SURG ORHT 6 SPLT 77X108 (DRAPES) ×2 IMPLANT
DRAPE U-SHAPE 47X51 STRL (DRAPES) ×3 IMPLANT
DRILL 3.0 6IN (DRILL) ×3
DRSG ADAPTIC 3X8 NADH LF (GAUZE/BANDAGES/DRESSINGS) ×6 IMPLANT
DRSG PAD ABDOMINAL 8X10 ST (GAUZE/BANDAGES/DRESSINGS) IMPLANT
ELECT REM PT RETURN 9FT ADLT (ELECTROSURGICAL) ×3
ELECTRODE REM PT RTRN 9FT ADLT (ELECTROSURGICAL) ×1 IMPLANT
EVACUATOR 1/8 PVC DRAIN (DRAIN) IMPLANT
EVACUATOR 3/16  PVC DRAIN (DRAIN)
EVACUATOR 3/16 PVC DRAIN (DRAIN) IMPLANT
GAUZE SPONGE 4X4 12PLY STRL (GAUZE/BANDAGES/DRESSINGS) IMPLANT
GAUZE SPONGE 4X4 12PLY STRL LF (GAUZE/BANDAGES/DRESSINGS) ×6 IMPLANT
GLOVE BIO SURGEON STRL SZ7.5 (GLOVE) ×3 IMPLANT
GLOVE BIO SURGEON STRL SZ8 (GLOVE) ×3 IMPLANT
GLOVE BIOGEL PI IND STRL 7.5 (GLOVE) ×1 IMPLANT
GLOVE BIOGEL PI IND STRL 8 (GLOVE) ×1 IMPLANT
GLOVE BIOGEL PI INDICATOR 7.5 (GLOVE) ×2
GLOVE BIOGEL PI INDICATOR 8 (GLOVE) ×2
GOWN STRL REUS W/ TWL LRG LVL3 (GOWN DISPOSABLE) ×2 IMPLANT
GOWN STRL REUS W/ TWL XL LVL3 (GOWN DISPOSABLE) ×1 IMPLANT
GOWN STRL REUS W/TWL LRG LVL3 (GOWN DISPOSABLE) ×4
GOWN STRL REUS W/TWL XL LVL3 (GOWN DISPOSABLE) ×2
GUIDEWIRE 1.6 6IN (WIRE) ×9 IMPLANT
IMMOBILIZER KNEE 22 UNIV (SOFTGOODS) ×3 IMPLANT
KIT BASIN OR (CUSTOM PROCEDURE TRAY) ×3 IMPLANT
KIT TURNOVER KIT B (KITS) ×3 IMPLANT
NDL SUT 6 .5 CRC .975X.05 MAYO (NEEDLE) IMPLANT
NEEDLE 22X1 1/2 (OR ONLY) (NEEDLE) IMPLANT
NEEDLE MAYO TAPER (NEEDLE)
NS IRRIG 1000ML POUR BTL (IV SOLUTION) ×3 IMPLANT
PACK ORTHO EXTREMITY (CUSTOM PROCEDURE TRAY) ×3 IMPLANT
PACK TOTAL JOINT (CUSTOM PROCEDURE TRAY) ×3 IMPLANT
PACK UNIVERSAL I (CUSTOM PROCEDURE TRAY) ×3 IMPLANT
PAD ABD 8X10 STRL (GAUZE/BANDAGES/DRESSINGS) ×6 IMPLANT
PAD ARMBOARD 7.5X6 YLW CONV (MISCELLANEOUS) ×6 IMPLANT
PAD CAST 4YDX4 CTTN HI CHSV (CAST SUPPLIES) ×1 IMPLANT
PADDING CAST COTTON 4X4 STRL (CAST SUPPLIES) ×2
PADDING CAST COTTON 6X4 STRL (CAST SUPPLIES) ×3 IMPLANT
PIN GUIDE DRILL TIP 2.8X300 (DRILL) ×12 IMPLANT
SCREW CANN 6.5X85X40MM THRD (Screw) ×6 IMPLANT
SCREW CANN 70X40X6.5 (Screw) ×1 IMPLANT
SCREW CANN 80X40X6.5 NS (Screw) ×1 IMPLANT
SCREW CANN HDLS 4X46 (Screw) ×3 IMPLANT
SCREW CANNULATED 6.5X70MM (Screw) ×2 IMPLANT
SCREW CANNULATED 6.5X80MM (Screw) ×2 IMPLANT
SET MONITOR QUICK PRESSURE (MISCELLANEOUS) ×3 IMPLANT
SPONGE LAP 18X18 RF (DISPOSABLE) IMPLANT
STAPLER VISISTAT 35W (STAPLE) ×3 IMPLANT
STOCKINETTE IMPERVIOUS LG (DRAPES) IMPLANT
SUCTION FRAZIER HANDLE 10FR (MISCELLANEOUS) ×2
SUCTION TUBE FRAZIER 10FR DISP (MISCELLANEOUS) ×1 IMPLANT
SUT ETHILON 2 0 FS 18 (SUTURE) ×6 IMPLANT
SUT ETHILON 2 0 PSLX (SUTURE) ×3 IMPLANT
SUT ETHILON 3 0 PS 1 (SUTURE) IMPLANT
SUT PROLENE 0 CT 2 (SUTURE) ×6 IMPLANT
SUT VIC AB 0 CT1 27 (SUTURE) ×4
SUT VIC AB 0 CT1 27XBRD ANBCTR (SUTURE) ×2 IMPLANT
SUT VIC AB 1 CT1 27 (SUTURE) ×4
SUT VIC AB 1 CT1 27XBRD ANBCTR (SUTURE) ×2 IMPLANT
SUT VIC AB 2-0 CT1 27 (SUTURE) ×4
SUT VIC AB 2-0 CT1 TAPERPNT 27 (SUTURE) ×2 IMPLANT
SYR 20ML ECCENTRIC (SYRINGE) IMPLANT
TOWEL GREEN STERILE (TOWEL DISPOSABLE) ×6 IMPLANT
TOWEL GREEN STERILE FF (TOWEL DISPOSABLE) ×3 IMPLANT
TRAY FOLEY MTR SLVR 16FR STAT (SET/KITS/TRAYS/PACK) IMPLANT
TUBE CONNECTING 12'X1/4 (SUCTIONS)
TUBE CONNECTING 12X1/4 (SUCTIONS) IMPLANT
WASHER FLAT 6.5MM (Washer) ×3 IMPLANT
WATER STERILE IRR 1000ML POUR (IV SOLUTION) ×6 IMPLANT
YANKAUER SUCT BULB TIP NO VENT (SUCTIONS) ×3 IMPLANT
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
## 2020-02-10 NOTE — Transfer of Care (Signed)
Immediate Anesthesia Transfer of Care Note  Patient: Bob Foley  Procedure(s) Performed: OPEN REDUCTION INTERNAL FIXATION (ORIF) DISTAL FEMUR FRACTURE (Right Knee) OPEN REDUCTION INTERNAL FIXATION (ORIF) TIBIAL PLATEAU (Right Knee)  Patient Location: PACU  Anesthesia Type:General  Level of Consciousness: drowsy  Airway & Oxygen Therapy: Patient Spontanous Breathing and Patient connected to nasal cannula oxygen  Post-op Assessment: Report given to RN and Post -op Vital signs reviewed and stable  Post vital signs: Reviewed and stable  Last Vitals:  Vitals Value Taken Time  BP 166/105 02/10/20 1125  Temp 36.8 C 02/10/20 1125  Pulse 90 02/10/20 1127  Resp 14 02/10/20 1127  SpO2 99 % 02/10/20 1127  Vitals shown include unvalidated device data.  Last Pain:  Vitals:   02/10/20 1125  TempSrc:   PainSc: (P) Asleep         Complications: No complications documented.
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
## 2020-02-10 NOTE — Progress Notes (Signed)
Orthopedic Tech Progress Note Patient Details:  Eliud Polo [birth year] [id number] Called In order to [employer] for a ROM KNEE BRACE Patient ID: Kaige Whistler, male   DOB: October 08, 1975, 44 y.o.   MRN: [id number]   Donald Pore 02/10/2020, 12:16 PM

## 2020-02-10 NOTE — Progress Notes (Signed)
Orthopedic Tech Progress Note Patient Details:  Bob Foley 1976/08/03 [id number] Micah Flesher to apply an OVER HEAD FRAME with TRAPEZE but patient bed has on old frame Patient ID: [name], male   DOB: 24-Dec-1975, 44 y.o.   MRN: [id number]   Donald Pore 02/10/2020, 2:35 PM

## 2020-02-11 ENCOUNTER — Encounter (HOSPITAL_COMMUNITY): Payer: Self-pay | Admitting: Orthopedic Surgery

## 2020-02-11 DIAGNOSIS — T79A29A Traumatic compartment syndrome of unspecified lower extremity, initial encounter: Secondary | ICD-10-CM

## 2020-02-11 DIAGNOSIS — S82101B Unspecified fracture of upper end of right tibia, initial encounter for open fracture type I or II: Secondary | ICD-10-CM

## 2020-02-11 DIAGNOSIS — E559 Vitamin D deficiency, unspecified: Secondary | ICD-10-CM

## 2020-02-11 DIAGNOSIS — W3400XA Accidental discharge from unspecified firearms or gun, initial encounter: Secondary | ICD-10-CM

## 2020-02-11 HISTORY — DX: Accidental discharge from unspecified firearms or gun, initial encounter: W34.00XA

## 2020-02-11 HISTORY — DX: Unspecified fracture of upper end of right tibia, initial encounter for open fracture type I or II: S82.101B

## 2020-02-11 HISTORY — DX: Vitamin D deficiency, unspecified: E55.9

## 2020-02-11 HISTORY — DX: Traumatic compartment syndrome of unspecified lower extremity, initial encounter: T79.A29A

## 2020-02-11 LAB — CBC
HCT: 26 % — ABNORMAL LOW (ref 39.0–52.0)
Hemoglobin: 8.6 g/dL — ABNORMAL LOW (ref 13.0–17.0)
MCH: 29.7 pg (ref 26.0–34.0)
MCHC: 33.1 g/dL (ref 30.0–36.0)
MCV: 89.7 fL (ref 80.0–100.0)
Platelets: 129 10*3/uL — ABNORMAL LOW (ref 150–400)
RBC: 2.9 MIL/uL — ABNORMAL LOW (ref 4.22–5.81)
RDW: 12.4 % (ref 11.5–15.5)
WBC: 8.7 10*3/uL (ref 4.0–10.5)
nRBC: 0 % (ref 0.0–0.2)

## 2020-02-11 LAB — COMPREHENSIVE METABOLIC PANEL
ALT: 29 U/L (ref 0–44)
AST: 66 U/L — ABNORMAL HIGH (ref 15–41)
Albumin: 3.1 g/dL — ABNORMAL LOW (ref 3.5–5.0)
Alkaline Phosphatase: 54 U/L (ref 38–126)
Anion gap: 7 (ref 5–15)
BUN: 7 mg/dL (ref 6–20)
CO2: 28 mmol/L (ref 22–32)
Calcium: 8.6 mg/dL — ABNORMAL LOW (ref 8.9–10.3)
Chloride: 99 mmol/L (ref 98–111)
Creatinine, Ser: 0.98 mg/dL (ref 0.61–1.24)
GFR calc Af Amer: >60 mL/min (ref >60)
GFR calc non Af Amer: >60 mL/min (ref >60)
Glucose, Bld: 100 mg/dL — ABNORMAL HIGH (ref 70–99)
Potassium: 3.6 mmol/L (ref 3.5–5.1)
Sodium: 134 mmol/L — ABNORMAL LOW (ref 135–145)
Total Bilirubin: 0.5 mg/dL (ref 0.3–1.2)
Total Protein: 5.9 g/dL — ABNORMAL LOW (ref 6.5–8.1)

## 2020-02-11 MED ORDER — DOCUSATE SODIUM 100 MG PO CAPS: 100.0000 mg | ORAL_CAPSULE | Freq: Two times a day (BID) | ORAL | 0 refills | Status: AC

## 2020-02-11 MED ORDER — OXYCODONE-ACETAMINOPHEN 5-325 MG PO TABS: 1.0000 | ORAL_TABLET | Freq: Four times a day (QID) | ORAL | 0 refills | Status: AC | PRN

## 2020-02-11 MED ORDER — RIVAROXABAN 10 MG PO TABS
10.0000 mg | ORAL_TABLET | Freq: Every day | ORAL | 0 refills | Status: AC
Start: 2020-02-11 — End: 2020-02-21

## 2020-02-11 MED ORDER — GABAPENTIN 300 MG PO CAPS
300.0000 mg | ORAL_CAPSULE | Freq: Three times a day (TID) | ORAL | 0 refills | Status: AC
Start: 2020-02-11 — End: 2020-03-03

## 2020-02-11 MED ORDER — ADULT MULTIVITAMIN W/MINERALS CH: 1.0000 | ORAL_TABLET | Freq: Every day | ORAL | 2 refills | Status: AC

## 2020-02-11 MED ORDER — METHOCARBAMOL 500 MG PO TABS: 500.0000 mg | ORAL_TABLET | Freq: Three times a day (TID) | ORAL | 0 refills | Status: AC | PRN

## 2020-02-11 MED ORDER — ACETAMINOPHEN 500 MG PO TABS: 500.0000 mg | ORAL_TABLET | Freq: Three times a day (TID) | ORAL | 0 refills | Status: AC

## 2020-02-11 MED ORDER — VITAMIN D3 25 MCG PO TABS: 2000.0000 [IU] | ORAL_TABLET | Freq: Two times a day (BID) | ORAL | 3 refills | Status: AC

## 2020-02-11 MED ORDER — ASCORBIC ACID 1000 MG PO TABS: 1000.0000 mg | ORAL_TABLET | Freq: Every day | ORAL | 0 refills | Status: AC

## 2020-02-11 NOTE — Care Management (Signed)
RW and 3n1 ordered to be delivered to patient's room prior to d/c.
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
## 2020-02-11 NOTE — Progress Notes (Signed)
Pt IV removed and AVS reviewed, all questions answered to pt and family satisfaction. Pt dressed and belongings gathered by family. Pt being wheeled down buy NT to family car.

## 2020-02-11 NOTE — Plan of Care (Signed)
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
## 2020-02-13 NOTE — Anesthesia Postprocedure Evaluation (Signed)
Anesthesia Post Note  Patient: Jhamir Pickup  Procedure(s) Performed: OPEN REDUCTION INTERNAL FIXATION (ORIF) DISTAL FEMUR FRACTURE (Right Knee) OPEN REDUCTION INTERNAL FIXATION (ORIF) TIBIAL PLATEAU (Right Knee)     Patient location during evaluation: PACU Anesthesia Type: General Level of consciousness: awake and alert Pain management: pain level controlled Vital Signs Assessment: post-procedure vital signs reviewed and stable Respiratory status: spontaneous breathing, nonlabored ventilation, respiratory function stable and patient connected to nasal cannula oxygen Cardiovascular status: blood pressure returned to baseline and stable Postop Assessment: no apparent nausea or vomiting Anesthetic complications: no   No complications documented.  Last Vitals:  Vitals:   02/11/20 0448 02/11/20 0900  BP: (!) 142/95 119/75  Pulse: 84 90  Resp: 16 17  Temp: 37.2 C 36.9 C  SpO2: 100% 98%    Last Pain:  Vitals:   02/11/20 0910  TempSrc:   PainSc: 0-No pain                 Nathania Waldman COKER

## 2020-02-14 ENCOUNTER — Encounter (HOSPITAL_COMMUNITY): Payer: Self-pay | Admitting: Orthopedic Surgery
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
## 2021-04-09 ENCOUNTER — Other Ambulatory Visit: Payer: Self-pay

## 2021-04-09 ENCOUNTER — Encounter (HOSPITAL_COMMUNITY): Payer: Self-pay | Admitting: Emergency Medicine

## 2021-04-09 ENCOUNTER — Emergency Department (HOSPITAL_COMMUNITY)
Admission: EM | Admit: 2021-04-09 | Discharge: 2021-04-09 | Disposition: A | Discharge status: Home / Self Care | Payer: Medicaid Other | Attending: Emergency Medicine | Admitting: Emergency Medicine

## 2021-04-09 DIAGNOSIS — Z91030 Bee allergy status: Secondary | ICD-10-CM | POA: Diagnosis not present

## 2021-04-09 DIAGNOSIS — F17210 Nicotine dependence, cigarettes, uncomplicated: Secondary | ICD-10-CM | POA: Diagnosis not present

## 2021-04-09 DIAGNOSIS — Z7901 Long term (current) use of anticoagulants: Secondary | ICD-10-CM | POA: Insufficient documentation

## 2021-04-09 DIAGNOSIS — T63441A Toxic effect of venom of bees, accidental (unintentional), initial encounter: Secondary | ICD-10-CM | POA: Insufficient documentation

## 2021-04-09 MED ORDER — FAMOTIDINE 20 MG PO TABS
20.0000 mg | ORAL_TABLET | Freq: Two times a day (BID) | ORAL | 0 refills | Status: AC
Start: 2021-04-09 — End: ?

## 2021-04-09 MED ORDER — DIPHENHYDRAMINE HCL 25 MG PO TABS
25.0000 mg | ORAL_TABLET | Freq: Four times a day (QID) | ORAL | 0 refills | Status: AC | PRN
Start: 2021-04-09 — End: ?

## 2021-04-09 MED ORDER — PREDNISONE 10 MG PO TABS
40.0000 mg | ORAL_TABLET | Freq: Every day | ORAL | 0 refills | Status: AC
Start: 2021-04-09 — End: ?

## 2021-04-09 MED ORDER — EPINEPHRINE 0.3 MG/0.3ML IJ SOAJ
0.3000 mg | INTRAMUSCULAR | 0 refills | Status: AC | PRN
Start: 2021-04-09 — End: ?

## 2021-04-09 NOTE — ED Triage Notes (Signed)
Pt arrives via [facility] after being stung multiple time by bees and is allergic. Did not have epi pen. 0.3 EPI, 50 mg bendadry, 125 solumedrol IV admin by EMS. Angioedema noted, airway patent at this time.

## 2021-04-09 NOTE — ED Notes (Signed)
Pt noted with swelling of the lips, but ems states this has gone down. Tongue does not appear to be swollen at this time. Pt breathsounds clear and O2 sat 100% on room air. Cardiac rhythm NSR. Pt states he feels the medication from ems has helped and he is not complaining of SOB. Mild periorbital swelling noted. Notified pt that we would need to keep him on cardiac monitoring to observe rhythms after getting epi pen.

## 2021-04-09 NOTE — ED Notes (Signed)
Pt lips are mildly swollen, pt airway intact, no wheezes noted

## 2021-04-09 NOTE — Discharge Instructions (Addendum)
Take the Benadryl 25 mg every 6 hours for the next 24 hours.  Take the Pepcid for the next 7 days.  Take prednisone for the next 5 days.  Return for any new or worse symptoms.  Work note provided to be out of work for the next 2 days.  Prescription for EpiPen provided.   Would recommend continue to take the Benadryl tonight.  Otherwise the other medications the prednisone and the Pepcid you can start taking tomorrow.

## 2021-04-09 NOTE — ED Notes (Signed)
Report received from [facility]; pt agitated in room, requesting to see a provider or he is going to leave
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
## 2021-04-09 NOTE — ED Notes (Signed)
Pt given urinal.

## 2022-02-09 ENCOUNTER — Emergency Department (HOSPITAL_COMMUNITY): Payer: Medicaid Other

## 2022-02-09 ENCOUNTER — Other Ambulatory Visit: Payer: Self-pay

## 2022-02-09 ENCOUNTER — Encounter (HOSPITAL_COMMUNITY): Payer: Self-pay | Admitting: *Deleted

## 2022-02-09 ENCOUNTER — Emergency Department (HOSPITAL_COMMUNITY)
Admission: EM | Admit: 2022-02-09 | Discharge: 2022-02-09 | Disposition: A | Discharge status: Home / Self Care | Payer: Medicaid Other | Attending: Emergency Medicine | Admitting: Emergency Medicine

## 2022-02-09 DIAGNOSIS — S0101XA Laceration without foreign body of scalp, initial encounter: Secondary | ICD-10-CM | POA: Diagnosis not present

## 2022-02-09 DIAGNOSIS — S1181XA Laceration without foreign body of other specified part of neck, initial encounter: Secondary | ICD-10-CM | POA: Diagnosis not present

## 2022-02-09 DIAGNOSIS — W269XXA Contact with unspecified sharp object(s), initial encounter: Secondary | ICD-10-CM | POA: Insufficient documentation

## 2022-02-09 DIAGNOSIS — Z23 Encounter for immunization: Secondary | ICD-10-CM | POA: Insufficient documentation

## 2022-02-09 DIAGNOSIS — S01312A Laceration without foreign body of left ear, initial encounter: Secondary | ICD-10-CM | POA: Insufficient documentation

## 2022-02-09 DIAGNOSIS — S0990XA Unspecified injury of head, initial encounter: Secondary | ICD-10-CM | POA: Diagnosis present

## 2022-02-09 DIAGNOSIS — S1191XA Laceration without foreign body of unspecified part of neck, initial encounter: Secondary | ICD-10-CM

## 2022-02-09 LAB — CBC WITH DIFFERENTIAL/PLATELET
Abs Immature Granulocytes: 0.02 10*3/uL (ref 0.00–0.07)
Basophils Absolute: 0 10*3/uL (ref 0.0–0.1)
Basophils Relative: 0 %
Eosinophils Absolute: 0.1 10*3/uL (ref 0.0–0.5)
Eosinophils Relative: 1 %
HCT: 36.9 % — ABNORMAL LOW (ref 39.0–52.0)
Hemoglobin: 12.3 g/dL — ABNORMAL LOW (ref 13.0–17.0)
Immature Granulocytes: 0 %
Lymphocytes Relative: 32 %
Lymphs Abs: 2.4 10*3/uL (ref 0.7–4.0)
MCH: 29.9 pg (ref 26.0–34.0)
MCHC: 33.3 g/dL (ref 30.0–36.0)
MCV: 89.8 fL (ref 80.0–100.0)
Monocytes Absolute: 0.6 10*3/uL (ref 0.1–1.0)
Monocytes Relative: 9 %
Neutro Abs: 4.4 10*3/uL (ref 1.7–7.7)
Neutrophils Relative %: 58 %
Platelets: 201 10*3/uL (ref 150–400)
RBC: 4.11 MIL/uL — ABNORMAL LOW (ref 4.22–5.81)
RDW: 13 % (ref 11.5–15.5)
WBC: 7.5 10*3/uL (ref 4.0–10.5)
nRBC: 0 % (ref 0.0–0.2)

## 2022-02-09 LAB — TYPE AND SCREEN
ABO/RH(D): O POS
Antibody Screen: NEGATIVE

## 2022-02-09 LAB — COMPREHENSIVE METABOLIC PANEL
ALT: 25 U/L (ref 0–44)
AST: 46 U/L — ABNORMAL HIGH (ref 15–41)
Albumin: 4 g/dL (ref 3.5–5.0)
Alkaline Phosphatase: 75 U/L (ref 38–126)
Anion gap: 9 (ref 5–15)
BUN: 11 mg/dL (ref 6–20)
CO2: 23 mmol/L (ref 22–32)
Calcium: 8.3 mg/dL — ABNORMAL LOW (ref 8.9–10.3)
Chloride: 106 mmol/L (ref 98–111)
Creatinine, Ser: 1.02 mg/dL (ref 0.61–1.24)
GFR, Estimated: >60 mL/min (ref >60)
Glucose, Bld: 85 mg/dL (ref 70–99)
Potassium: 3.6 mmol/L (ref 3.5–5.1)
Sodium: 138 mmol/L (ref 135–145)
Total Bilirubin: 0.3 mg/dL (ref 0.3–1.2)
Total Protein: 7.5 g/dL (ref 6.5–8.1)

## 2022-02-09 LAB — I-STAT CHEM 8, ED
BUN: 12 mg/dL (ref 6–20)
Calcium, Ion: 1.08 mmol/L — ABNORMAL LOW (ref 1.15–1.40)
Chloride: 105 mmol/L (ref 98–111)
Creatinine, Ser: 1.4 mg/dL — ABNORMAL HIGH (ref 0.61–1.24)
Glucose, Bld: 81 mg/dL (ref 70–99)
HCT: 40 % (ref 39.0–52.0)
Hemoglobin: 13.6 g/dL (ref 13.0–17.0)
Potassium: 5.2 mmol/L — ABNORMAL HIGH (ref 3.5–5.1)
Sodium: 140 mmol/L (ref 135–145)
TCO2: 26 mmol/L (ref 22–32)

## 2022-02-09 LAB — ABO/RH: ABO/RH(D): O POS

## 2022-02-09 LAB — ETHANOL: Alcohol, Ethyl (B): 364 mg/dL (ref <10)

## 2022-02-09 MED ORDER — SODIUM CHLORIDE 0.9 % IV BOLUS
1000.0000 mL | Freq: Once | INTRAVENOUS | Status: AC
  Administered 2022-02-09: 1000 mL via INTRAVENOUS

## 2022-02-09 MED ORDER — IOHEXOL 350 MG/ML SOLN
75.0000 mL | Freq: Once | INTRAVENOUS | Status: AC | PRN
  Administered 2022-02-09: 75 mL via INTRAVENOUS

## 2022-02-09 MED ORDER — TETANUS-DIPHTH-ACELL PERTUSSIS 5-2.5-18.5 LF-MCG/0.5 IM SUSY
0.5000 mL | PREFILLED_SYRINGE | Freq: Once | INTRAMUSCULAR | Status: AC
  Administered 2022-02-09: 0.5 mL via INTRAMUSCULAR
  Filled 2022-02-09: qty 0.5

## 2022-02-09 MED ORDER — LIDOCAINE-EPINEPHRINE (PF) 2 %-1:200000 IJ SOLN
20.0000 mL | Freq: Once | INTRAMUSCULAR | Status: AC
  Administered 2022-02-09: 20 mL
  Filled 2022-02-09: qty 20
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
## 2022-02-09 NOTE — ED Notes (Signed)
ED Provider at bedside. 

## 2022-02-09 NOTE — ED Notes (Signed)
Pt father, Reece Leader called by this nurse to inform that pt is up for discharge and ready to be picked up, reports he will be here within an hour.
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
